# Patient Record
Sex: Male | Born: 1970 | Race: White | Hispanic: No | Marital: Single | Smoking: Never smoker
Health system: Southern US, Community
[De-identification: ages and names within clinical notes are randomized; demographics above are authoritative.]

## PROBLEM LIST (undated history)

## (undated) DIAGNOSIS — N200 Calculus of kidney: Secondary | ICD-10-CM

## (undated) HISTORY — PX: LEG SURGERY: SHX1003

## (undated) HISTORY — PX: SHOULDER SURGERY: SHX246

## (undated) HISTORY — PX: KNEE SURGERY: SHX244

---

## 2016-03-25 ENCOUNTER — Emergency Department (HOSPITAL_COMMUNITY)
Admission: EM | Admit: 2016-03-25 | Discharge: 2016-03-25 | Disposition: A | Discharge status: Home / Self Care | Payer: Managed Care, Other (non HMO) | Attending: Emergency Medicine | Admitting: Emergency Medicine

## 2016-03-25 ENCOUNTER — Emergency Department (HOSPITAL_COMMUNITY): Payer: Managed Care, Other (non HMO)

## 2016-03-25 ENCOUNTER — Encounter (HOSPITAL_COMMUNITY): Payer: Self-pay | Admitting: Emergency Medicine

## 2016-03-25 DIAGNOSIS — N201 Calculus of ureter: Secondary | ICD-10-CM

## 2016-03-25 DIAGNOSIS — R1031 Right lower quadrant pain: Secondary | ICD-10-CM | POA: Diagnosis present

## 2016-03-25 DIAGNOSIS — R109 Unspecified abdominal pain: Secondary | ICD-10-CM

## 2016-03-25 HISTORY — DX: Calculus of kidney: N20.0

## 2016-03-25 LAB — CBC WITH DIFFERENTIAL/PLATELET
BASOS ABS: 0 10*3/uL (ref 0.0–0.1)
BASOS PCT: 0 %
Eosinophils Absolute: 0.1 10*3/uL (ref 0.0–0.7)
Eosinophils Relative: 1 %
HEMATOCRIT: 43.1 % (ref 39.0–52.0)
HEMOGLOBIN: 15 g/dL (ref 13.0–17.0)
Lymphocytes Relative: 12 %
Lymphs Abs: 1.7 10*3/uL (ref 0.7–4.0)
MCH: 29.1 pg (ref 26.0–34.0)
MCHC: 34.8 g/dL (ref 30.0–36.0)
MCV: 83.5 fL (ref 78.0–100.0)
MONOS PCT: 6 %
Monocytes Absolute: 0.8 10*3/uL (ref 0.1–1.0)
NEUTROS ABS: 12.1 10*3/uL — AB (ref 1.7–7.7)
NEUTROS PCT: 81 %
Platelets: 372 10*3/uL (ref 150–400)
RBC: 5.16 MIL/uL (ref 4.22–5.81)
RDW: 13.6 % (ref 11.5–15.5)
WBC: 14.7 10*3/uL — ABNORMAL HIGH (ref 4.0–10.5)

## 2016-03-25 LAB — URINALYSIS, ROUTINE W REFLEX MICROSCOPIC
Bilirubin Urine: NEGATIVE
Glucose, UA: NEGATIVE mg/dL
Ketones, ur: NEGATIVE mg/dL
LEUKOCYTES UA: NEGATIVE
NITRITE: NEGATIVE
PH: 5.5 (ref 5.0–8.0)
Protein, ur: 30 mg/dL — AB
SPECIFIC GRAVITY, URINE: 1.03 (ref 1.005–1.030)

## 2016-03-25 LAB — BASIC METABOLIC PANEL
ANION GAP: 12 (ref 5–15)
BUN: 25 mg/dL — ABNORMAL HIGH (ref 6–20)
CHLORIDE: 106 mmol/L (ref 101–111)
CO2: 22 mmol/L (ref 22–32)
Calcium: 9.3 mg/dL (ref 8.9–10.3)
Creatinine, Ser: 1.21 mg/dL (ref 0.61–1.24)
GFR calc non Af Amer: >60 mL/min (ref >60)
Glucose, Bld: 160 mg/dL — ABNORMAL HIGH (ref 65–99)
Potassium: 3.4 mmol/L — ABNORMAL LOW (ref 3.5–5.1)
Sodium: 140 mmol/L (ref 135–145)

## 2016-03-25 LAB — URINE MICROSCOPIC-ADD ON: Squamous Epithelial / LPF: NONE SEEN

## 2016-03-25 MED ORDER — TAMSULOSIN HCL 0.4 MG PO CAPS: 0.4000 mg | ORAL_CAPSULE | Freq: Every day | ORAL | 0 refills | Status: AC

## 2016-03-25 MED ORDER — HYDROMORPHONE HCL 1 MG/ML IJ SOLN
1.0000 mg | Freq: Once | INTRAMUSCULAR | Status: AC
  Administered 2016-03-25: 1 mg via INTRAVENOUS
  Filled 2016-03-25: qty 1

## 2016-03-25 MED ORDER — OXYCODONE-ACETAMINOPHEN 5-325 MG PO TABS
2.0000 | ORAL_TABLET | Freq: Once | ORAL | Status: DC
  Filled 2016-03-25: qty 2

## 2016-03-25 MED ORDER — KETOROLAC TROMETHAMINE 15 MG/ML IJ SOLN
15.0000 mg | Freq: Once | INTRAMUSCULAR | Status: AC
  Administered 2016-03-25: 15 mg via INTRAVENOUS
  Filled 2016-03-25: qty 1

## 2016-03-25 MED ORDER — ONDANSETRON HCL 4 MG/2ML IJ SOLN
4.0000 mg | Freq: Once | INTRAMUSCULAR | Status: AC
  Administered 2016-03-25: 4 mg via INTRAVENOUS
  Filled 2016-03-25: qty 2

## 2016-03-25 MED ORDER — TAMSULOSIN HCL 0.4 MG PO CAPS
0.4000 mg | ORAL_CAPSULE | Freq: Once | ORAL | Status: AC
  Administered 2016-03-25: 0.4 mg via ORAL
  Filled 2016-03-25: qty 1

## 2016-03-25 MED ORDER — SODIUM CHLORIDE 0.9 % IV BOLUS (SEPSIS)
1000.0000 mL | Freq: Once | INTRAVENOUS | Status: AC
  Administered 2016-03-25: 1000 mL via INTRAVENOUS

## 2016-03-25 MED ORDER — ONDANSETRON HCL 4 MG/2ML IJ SOLN: 4.0000 mg | Freq: Once | INTRAMUSCULAR | Status: DC

## 2016-03-25 MED ORDER — HYDROCODONE-ACETAMINOPHEN 5-325 MG PO TABS: 1.0000 | ORAL_TABLET | ORAL | 0 refills | Status: AC | PRN

## 2016-03-25 MED ORDER — HYDROCODONE-ACETAMINOPHEN 5-325 MG PO TABS
2.0000 | ORAL_TABLET | Freq: Once | ORAL | Status: AC
  Administered 2016-03-25: 2 via ORAL
  Filled 2016-03-25: qty 2

## 2016-03-25 MED ORDER — NAPROXEN 500 MG PO TABS: 500.0000 mg | ORAL_TABLET | Freq: Two times a day (BID) | ORAL | 0 refills | Status: AC

## 2016-03-25 NOTE — ED Notes (Signed)
Patient in restroom trying to collect urine sample.

## 2016-03-25 NOTE — ED Triage Notes (Signed)
Patient c/o right flank pain, hx of kidney stones, pain feels the same. Patient reports urgency to urinate with minimal output. Emesis x5 today.

## 2016-03-25 NOTE — ED Notes (Signed)
Patient transported to CT 

## 2016-03-25 NOTE — Discharge Instructions (Signed)
DRINK PLENTY OF FLUIDS  RETURN TO THE ED IF YOUR PAIN CANNOT BE CONTROLLED, YOU DEVELOP FEVER, OR CANNOT KEEP ANY FOOD DOWN

## 2016-03-25 NOTE — ED Notes (Signed)
Bed: WA17 Expected date:  Expected time:  Means of arrival:  Comments: triage 

## 2016-03-25 NOTE — ED Notes (Signed)
Patient presents with right flank pain.  Patient reports he was just hospitalized for 6 days for kidney stones without lithotripsy.  During IV start, patient very agitated in regards to his pain and asked for pain medication.  Also reports he cannot urinate, but gave urine in triage.  Patient requesting a catheter.

## 2016-03-26 NOTE — ED Provider Notes (Signed)
[facility]-EMERGENCY DEPT Provider Note   CSN: [id number] Arrival date & time: 03/25/16  1649  First Provider Contact:  None       History   Chief Complaint Chief Complaint  Patient presents with  . Flank Pain    right    HPI Nicholas Gibson is a 45 y.o. male.  The history is provided by the patient, the spouse and medical records.  Abdominal Pain   This is a recurrent problem. The current episode started 12 to 24 hours ago. The problem occurs constantly. The pain is associated with an unknown factor. The pain is located in the RUQ and RLQ (Right flank). The quality of the pain is aching, cramping and colicky. The pain is at a severity of 10/10. The pain is severe. Associated symptoms include nausea and vomiting. Pertinent negatives include anorexia, fever, diarrhea, dysuria, frequency and headaches. Nothing aggravates the symptoms. The symptoms are relieved by certain positions. Past workup includes CT scan. His past medical history does not include gallstones, GERD or Crohn's disease.    Past Medical History:  Diagnosis Date  . Kidney stones     There are no active problems to display for this patient.   Past Surgical History:  Procedure Laterality Date  . KNEE SURGERY Left   . LEG SURGERY Left   . SHOULDER SURGERY Right        Home Medications    Prior to Admission medications   Medication Sig Start Date End Date Taking? Authorizing Provider  HYDROcodone-acetaminophen (NORCO/VICODIN) 5-325 MG tablet Take 1-2 tablets by mouth every 4 (four) hours as needed for moderate pain or severe pain (Take 1 tablet for moderate pain, 2 tablets for severe pain). 03/25/16   Shaune Pollackameron Ritisha Deitrick, MD  naproxen (NAPROSYN) 500 MG tablet Take 1 tablet (500 mg total) by mouth 2 (two) times daily. 03/25/16   Shaune Pollackameron Gotti Alwin, MD  tamsulosin (FLOMAX) 0.4 MG CAPS capsule Take 1 capsule (0.4 mg total) by mouth daily. 03/25/16   Shaune Pollackameron Onix Jumper, MD    Family History History reviewed. No pertinent family  history.  Social History Social History  Substance Use Topics  . Smoking status: Never Smoker  . Smokeless tobacco: Never Used  . Alcohol use Yes     Allergies   Shellfish-derived products and Penicillins   Review of Systems Review of Systems  Constitutional: Negative for chills, fatigue and fever.  HENT: Negative for congestion and rhinorrhea.   Eyes: Negative for visual disturbance.  Respiratory: Negative for cough, shortness of breath and wheezing.   Cardiovascular: Negative for chest pain and leg swelling.  Gastrointestinal: Positive for abdominal pain, nausea and vomiting. Negative for anorexia and diarrhea.  Genitourinary: Positive for difficulty urinating and flank pain. Negative for dysuria and frequency.  Musculoskeletal: Negative for neck pain and neck stiffness.  Skin: Negative for rash and wound.  Allergic/Immunologic: Negative for immunocompromised state.  Neurological: Negative for syncope, weakness and headaches.     Physical Exam Updated Vital Signs BP 124/78   Pulse 61   Temp 97.9 F (36.6 C) (Oral)   Resp 20   Ht 5\' 9"  (1.753 m)   Wt 163 lb (73.9 kg)   SpO2 94%   BMI 24.07 kg/m   Physical Exam  Constitutional: He is oriented to person, place, and time. He appears well-developed and well-nourished. He appears distressed.  HENT:  Head: Normocephalic and atraumatic.  Mouth/Throat: Oropharynx is clear and moist.  Eyes: Conjunctivae are normal. Pupils are equal, round, and reactive to  light.  Neck: Neck supple.  Cardiovascular: Normal rate, regular rhythm and normal heart sounds.  Exam reveals no friction rub.   No murmur heard. Pulmonary/Chest: Effort normal and breath sounds normal. No respiratory distress. He has no wheezes. He has no rales.  Abdominal: Soft. Normal appearance and bowel sounds are normal. He exhibits no distension. There is tenderness in the right upper quadrant and right lower quadrant. There is CVA tenderness (right-sided).  There is no rigidity, no rebound, no guarding, no tenderness at McBurney's point and negative Murphy's sign.  Musculoskeletal: He exhibits no edema.  Neurological: He is alert and oriented to person, place, and time. He exhibits normal muscle tone.  Skin: Skin is warm. Capillary refill takes less than 2 seconds.  Nursing note and vitals reviewed.    ED Treatments / Results  Labs (all labs ordered are listed, but only abnormal results are displayed) Labs Reviewed  URINALYSIS, ROUTINE W REFLEX MICROSCOPIC (NOT AT [facility]) - Abnormal; Notable for the following:       Result Value   Color, Urine AMBER (*)    APPearance CLOUDY (*)    Hgb urine dipstick LARGE (*)    Protein, ur 30 (*)    All other components within normal limits  CBC WITH DIFFERENTIAL/PLATELET - Abnormal; Notable for the following:    WBC 14.7 (*)    Neutro Abs 12.1 (*)    All other components within normal limits  BASIC METABOLIC PANEL - Abnormal; Notable for the following:    Potassium 3.4 (*)    Glucose, Bld 160 (*)    BUN 25 (*)    All other components within normal limits  URINE MICROSCOPIC-ADD ON - Abnormal; Notable for the following:    Bacteria, UA FEW (*)    Crystals CA OXALATE CRYSTALS (*)    All other components within normal limits    EKG  EKG Interpretation None       Radiology Ct Renal Stone Study  Result Date: 03/25/2016 CLINICAL DATA:  Right flank pain since 3 pm today.  Emesis. EXAM: CT ABDOMEN AND PELVIS WITHOUT CONTRAST TECHNIQUE: Multidetector CT imaging of the abdomen and pelvis was performed following the standard protocol without IV contrast. COMPARISON:  09/03/2008 FINDINGS: Lower chest:  Small type 1 hiatal hernia. Hepatobiliary: Slightly distended gallbladder, 4.2 cm diameter. No definite gallbladder wall thickening or pericholecystic fluid. Pancreas: Unremarkable Spleen: Unremarkable Adrenals/Urinary Tract: Mild right hydronephrosis and hydroureter due to a 3 mm right ureterovesical  junction stone. Bilateral nonobstructive nephrolithiasis including a 4 mm right mid kidney stone, a 2 mm right kidney lower pole stone, a 2 mm left kidney upper pole stone, a 5 mm left kidney upper pole stone, a 3 mm left mid kidney stone, and a 1-2 mm left kidney lower pole stone. Stomach/Bowel: Unremarkable.  Appendix normal. Vascular/Lymphatic: Unremarkable Reproductive: Unremarkable Other: No supplemental non-categorized findings. 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 IMPRESSION: 1. Mild right hydronephrosis and hydroureter extending down to a 3 mm right UVJ stone. 2. Bilateral nonobstructive nephrolithiasis. 3. The moderate right foraminal impingement at L5-S1 due to right facet arthropathy and intervertebral spurring. 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 4. Small type 1 hiatal hernia. 5. Slightly distended gallbladder but without gallbladder wall thickening or pericholecystic fluid. Electronically Signed   By: Gaylyn RongWalter  Liebkemann M.D.   On: 03/25/2016 19:24    Procedures Procedures (including  critical care time)  Medications Ordered in ED Medications  ondansetron (ZOFRAN) injection 4 mg (4 mg Intravenous Given 03/25/16 1737)  sodium chloride 0.9 % bolus 1,000 mL (0 mLs Intravenous Stopped 03/25/16 1920)  HYDROmorphone (DILAUDID) injection 1 mg (1 mg Intravenous Given 03/25/16 1804)  HYDROmorphone (DILAUDID) injection 1 mg (1 mg Intravenous Given 03/25/16 1932)  ketorolac (TORADOL) 15 MG/ML injection 15 mg (15 mg Intravenous Given 03/25/16 1901)  tamsulosin (FLOMAX) capsule 0.4 mg (0.4 mg Oral Given 03/25/16 1953)  HYDROmorphone (DILAUDID) injection 1 mg (1 mg Intravenous Given 03/25/16 2032)  HYDROcodone-acetaminophen (NORCO/VICODIN) 5-325 MG per tablet 2 tablet (2 tablets Oral Given 03/25/16 2209)     Initial Impression / Assessment and Plan / ED  Course  I have reviewed the triage vital signs and the nursing notes.  Pertinent labs & imaging results that were available during my care of the patient were reviewed by me and considered in my medical decision making (see chart for details).  Clinical Course   45 yo M who p/w acute onset right flank pain. On arrival, VSS and WNL. Exam is as above. DDx includes renal colic, MSK spasm, doubt UTI or pyelo given absence of any fevers, dysuria. No testicular pain or signs of torsion. Will check UA, CT stone study, labs.  Labs, imaging as above. CT shows 3 mm obstructing stone. CBC has mild leukocytosis, likely reactive, and UA shows no leukocytes - doubt infected stone. BMP otherwise largely unremarakble. Pain improved with IVF, analgesia. Stone is already at [employer]. Will give pain control, fluids, flomax, and attempt outpt management. Return precautions given.  Final Clinical Impressions(s) / ED Diagnoses   Final diagnoses:  Right flank pain  Ureterolithiasis    New Prescriptions Discharge Medication List as of 03/25/2016 11:06 PM    START taking these medications   Details  HYDROcodone-acetaminophen (NORCO/VICODIN) 5-325 MG tablet Take 1-2 tablets by mouth every 4 (four) hours as needed for moderate pain or severe pain (Take 1 tablet for moderate pain, 2 tablets for severe pain)., Starting Sun 03/25/2016, Print    naproxen (NAPROSYN) 500 MG tablet Take 1 tablet (500 mg total) by mouth 2 (two) times daily., Starting Sun 03/25/2016, Print    tamsulosin (FLOMAX) 0.4 MG CAPS capsule Take 1 capsule (0.4 mg total) by mouth daily., Starting Sun 03/25/2016, Print         Shaune Pollack, MD 03/26/16 [phone]

## 2017-01-15 IMAGING — CT CT RENAL STONE PROTOCOL
2 of 3 series · 17 of 46 positions shown, 19 images · non-contrast
Comparison: 09/03/2008

CLINICAL DATA: Right flank pain since 3 pm today.  Emesis.

EXAM:
CT ABDOMEN AND PELVIS WITHOUT CONTRAST
TECHNIQUE: Multidetector CT imaging of the abdomen and pelvis was performed
following the standard protocol without IV contrast.

[Series 2: stone under (id) w prev · axial · 0.82mm/px · z∈[-382,+2]mm · 14 of 89 slices shown, 16 images]
[im 6/89  soft-tissue]
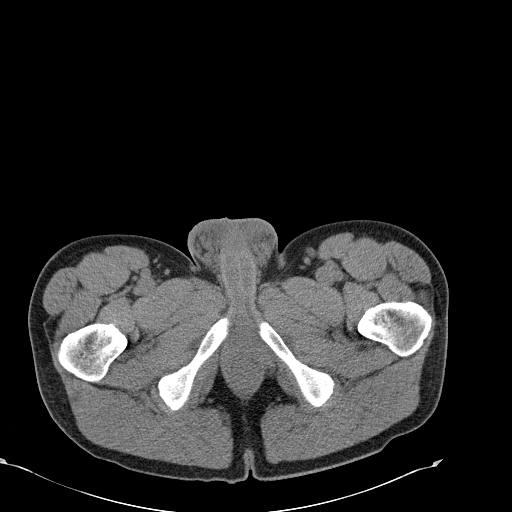
[im 6/89  bone]
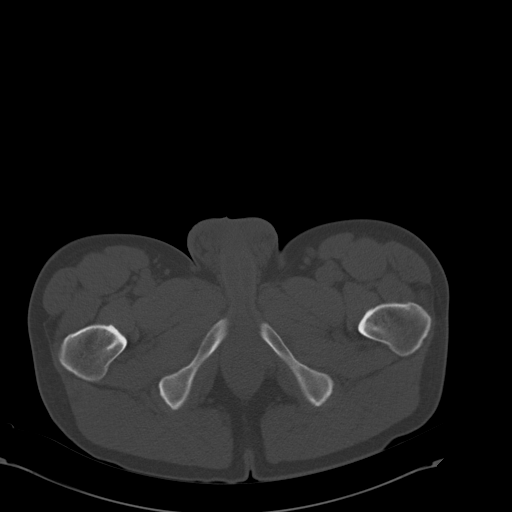
[im 12/89  soft-tissue]
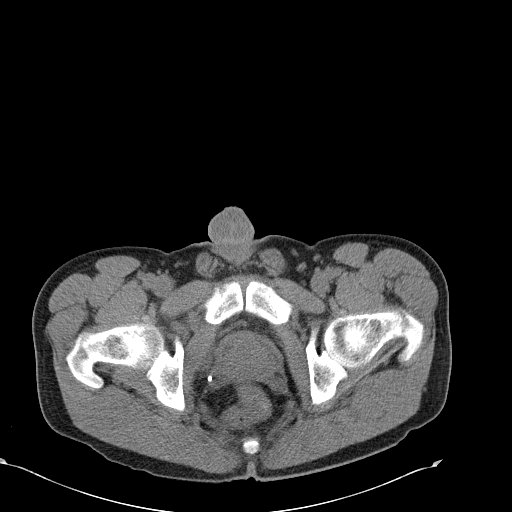
[im 18/89  soft-tissue]
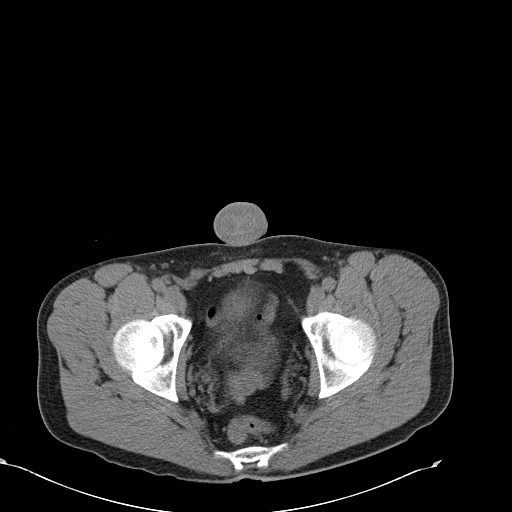
[im 23/89  soft-tissue]
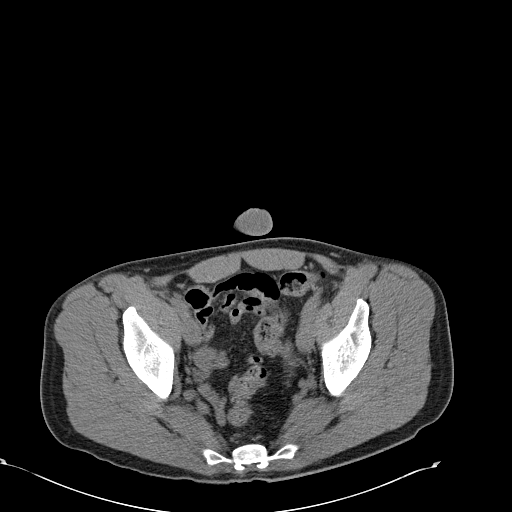
[im 29/89  soft-tissue]
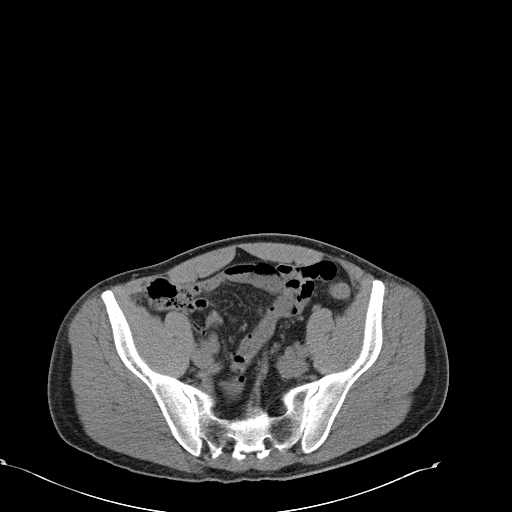
[im 35/89  soft-tissue]
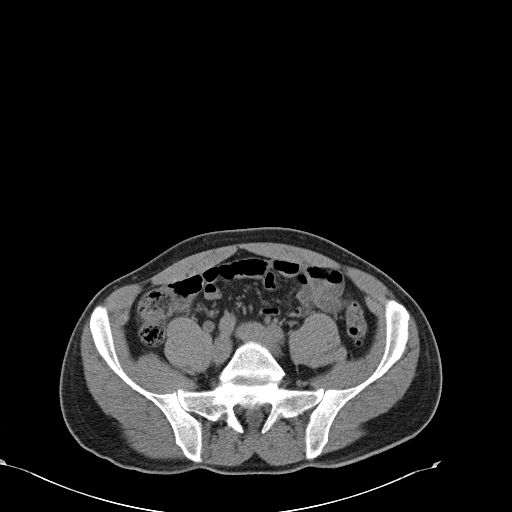
[im 40/89  soft-tissue]
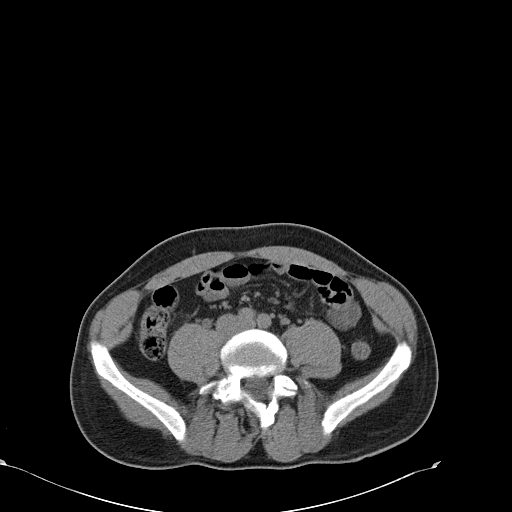
[im 49/89  soft-tissue]
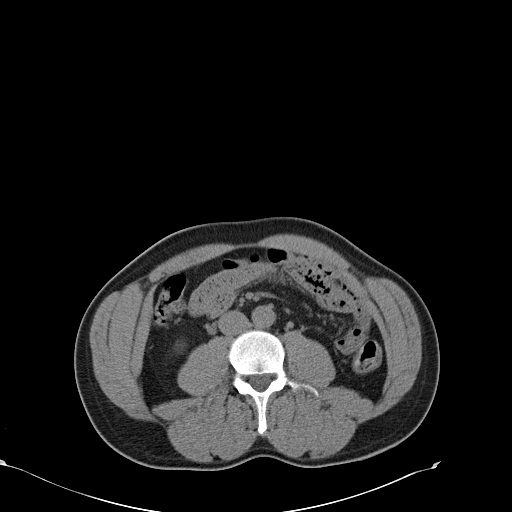
[im 54/89  soft-tissue]
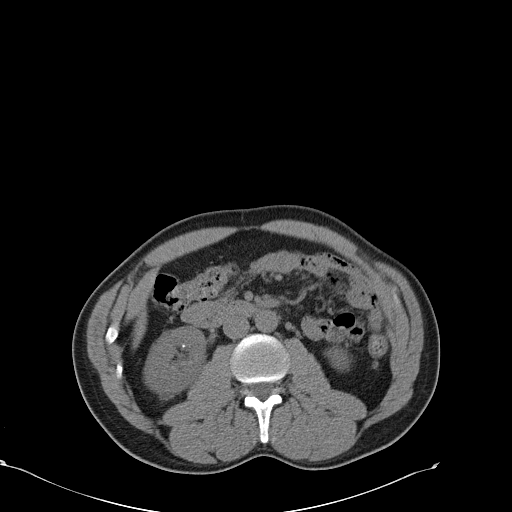
[im 54/89  bone]
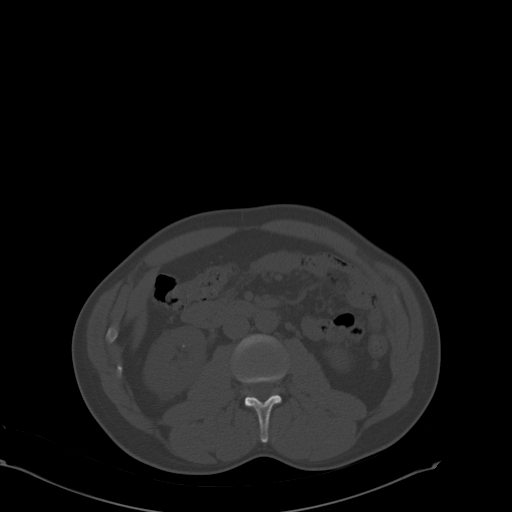
[im 60/89  soft-tissue]
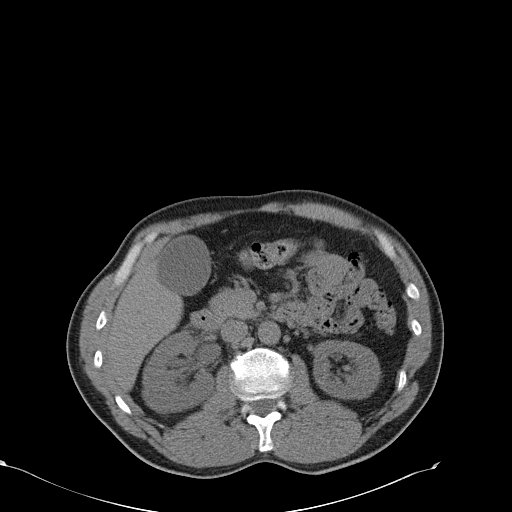
[im 66/89  soft-tissue]
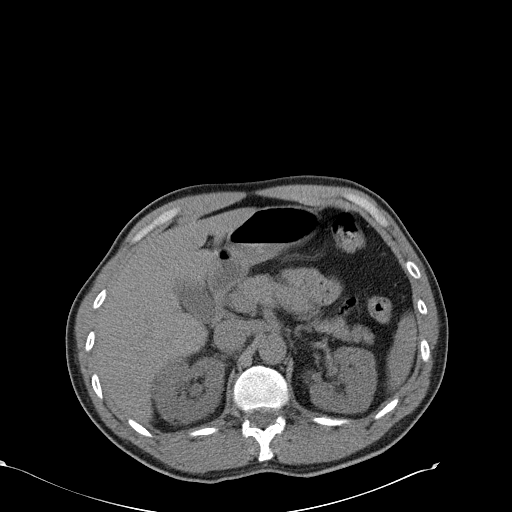
[im 71/89  soft-tissue]
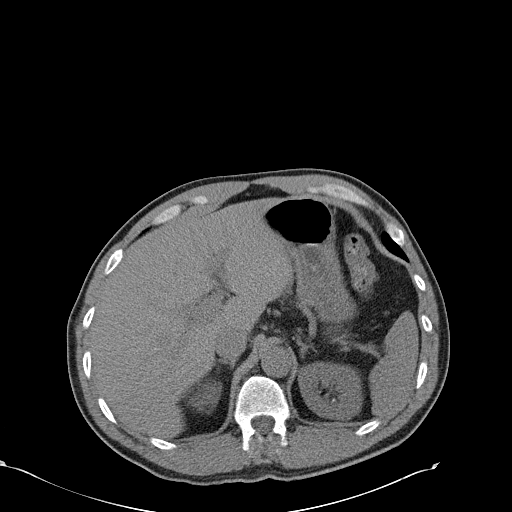
[im 77/89  soft-tissue]
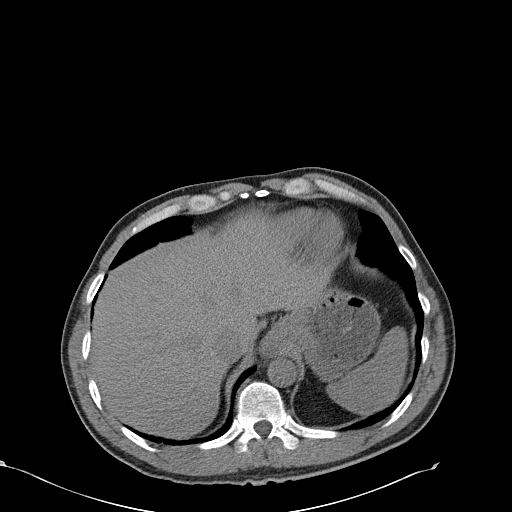
[im 83/89  soft-tissue]
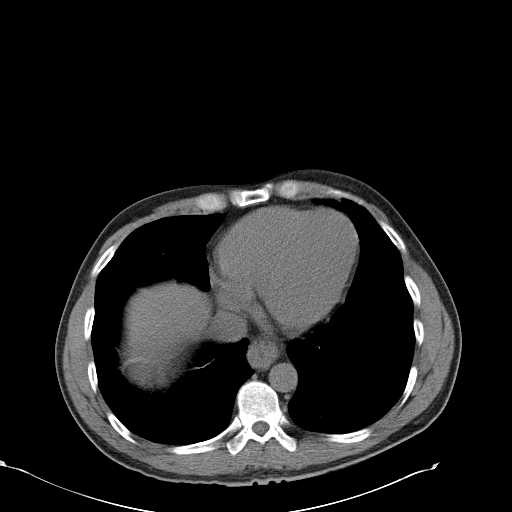

[Series 604: <mpr thick range(2)> · coronal · 0.87mm/px · 3 of 134 slices shown]
[im 45/134  soft-tissue]
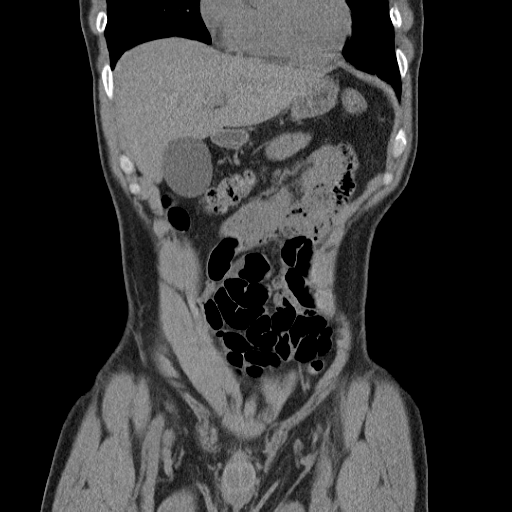
[im 60/134  soft-tissue]
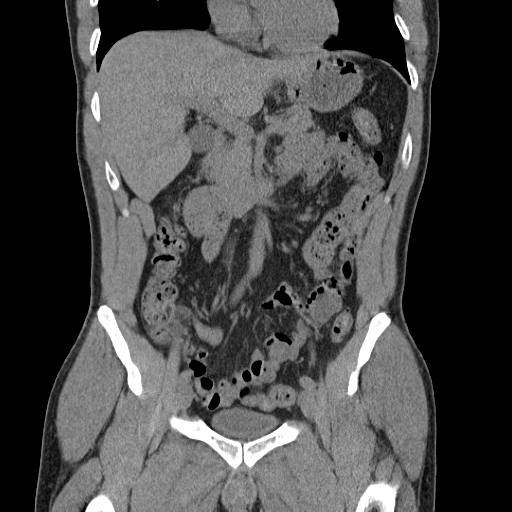
[im 74/134  soft-tissue]
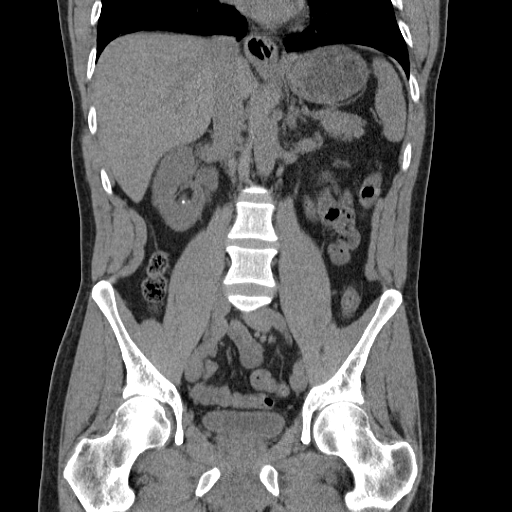

[17 of 46 positions shown; findings below may reference images not displayed]

FINDINGS: Lower chest:  Small type 1 hiatal hernia.

Hepatobiliary: Slightly distended gallbladder, 4.2 cm diameter. No
definite gallbladder wall thickening or pericholecystic fluid.

Pancreas: Unremarkable

Spleen: Unremarkable

Adrenals/Urinary Tract: Mild right hydronephrosis and hydroureter
due to a 3 mm right ureterovesical junction stone.

Bilateral nonobstructive nephrolithiasis including a 4 mm right mid
kidney stone, a 2 mm right kidney lower pole stone, a 2 mm left
kidney upper pole stone, a 5 mm left kidney upper pole stone, a 3 mm
left mid kidney stone, and a 1-2 mm left kidney lower pole stone.

Stomach/Bowel: Unremarkable.  Appendix normal.

Vascular/Lymphatic: Unremarkable

Reproductive: Unremarkable

Other: No supplemental non-categorized findings.

Musculoskeletal: Left unilateral pars defect at L5, with right facet
arthropathy and right intervertebral spurring at L5-S1 causing
moderate right foraminal stenosis.
IMPRESSION: 1. Mild right hydronephrosis and hydroureter extending down to a 3
mm right UVJ stone.
2. Bilateral nonobstructive nephrolithiasis.
3. The moderate right foraminal impingement at L5-S1 due to right
facet arthropathy and intervertebral spurring. Note is also made of
a left-sided unilateral pars defect at L5.
4. Small type 1 hiatal hernia.
5. Slightly distended gallbladder but without gallbladder wall
thickening or pericholecystic fluid.
